# Patient Record
Sex: Female | Born: 2000 | Race: Black or African American | Hispanic: Yes | Marital: Single | Smoking: Never smoker
Health system: Southern US, Community
[De-identification: ages and names within clinical notes are randomized; demographics above are authoritative.]

## PROBLEM LIST (undated history)

## (undated) DIAGNOSIS — T1491XA Suicide attempt, initial encounter: Secondary | ICD-10-CM

## (undated) DIAGNOSIS — R519 Headache, unspecified: Secondary | ICD-10-CM

## (undated) DIAGNOSIS — R45851 Suicidal ideations: Secondary | ICD-10-CM

## (undated) DIAGNOSIS — F329 Major depressive disorder, single episode, unspecified: Secondary | ICD-10-CM

## (undated) DIAGNOSIS — G039 Meningitis, unspecified: Secondary | ICD-10-CM

## (undated) DIAGNOSIS — F32A Depression, unspecified: Secondary | ICD-10-CM

## (undated) DIAGNOSIS — Z62820 Parent-biological child conflict: Secondary | ICD-10-CM

## (undated) DIAGNOSIS — R51 Headache: Secondary | ICD-10-CM

## (undated) HISTORY — PX: WISDOM TOOTH EXTRACTION: SHX21

---
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
## 2021-03-07 ENCOUNTER — Other Ambulatory Visit: Payer: Self-pay

## 2021-03-07 ENCOUNTER — Emergency Department (HOSPITAL_BASED_OUTPATIENT_CLINIC_OR_DEPARTMENT_OTHER)
Admission: EM | Admit: 2021-03-07 | Discharge: 2021-03-07 | Disposition: A | Discharge status: Home / Self Care | Attending: Emergency Medicine | Admitting: Emergency Medicine

## 2021-03-07 ENCOUNTER — Encounter (HOSPITAL_BASED_OUTPATIENT_CLINIC_OR_DEPARTMENT_OTHER): Payer: Self-pay | Admitting: *Deleted

## 2021-03-07 DIAGNOSIS — W07XXXA Fall from chair, initial encounter: Secondary | ICD-10-CM | POA: Insufficient documentation

## 2021-03-07 DIAGNOSIS — Y9289 Other specified places as the place of occurrence of the external cause: Secondary | ICD-10-CM | POA: Diagnosis not present

## 2021-03-07 DIAGNOSIS — S7002XA Contusion of left hip, initial encounter: Secondary | ICD-10-CM

## 2021-03-07 DIAGNOSIS — Z3A20 20 weeks gestation of pregnancy: Secondary | ICD-10-CM | POA: Diagnosis not present

## 2021-03-07 DIAGNOSIS — O9A212 Injury, poisoning and certain other consequences of external causes complicating pregnancy, second trimester: Secondary | ICD-10-CM | POA: Insufficient documentation

## 2021-03-07 NOTE — ED Triage Notes (Addendum)
C/o fall from chair injuring left hip at work x 3 hrs ago , pt is preg, denies abd pain or vaginal bleeding

## 2021-03-07 NOTE — Discharge Instructions (Signed)
You may take Tylenol available over-the-counter according to label instructions as needed for pain. If you develop abdominal cramping, vaginal bleeding or change in babies activity please call your OB/GYN immediately for further evaluation and workup.

## 2021-03-07 NOTE — ED Provider Notes (Signed)
[facility] EMERGENCY DEPARTMENT Provider Note   CSN: [id number] Arrival date & time: 03/07/21  1429     History Chief Complaint  Patient presents with   Hip Pain    preg    Gloria Gray is a 20 y.o. female.  The history is provided by the patient.  Hip Pain Gloria Gray is a 20 y.o. female who presents to the Emergency Department complaining of fall, hip pain. She is a G1P0 at [redacted]w[redacted]d that presents the emergency department for evaluation of injuries after she fell out of a chair at work today. This happened around noon. She states that the chair was broken and she fell, landing on her left hip. She does have soreness to the area but is able to ambulate. She is approximately five months pregnant. No abdominal pain, vaginal bleeding or leakage of fluids.     Past Medical History:  Diagnosis Date   Attempted suicide (HCC)    Depression    HA (headache)    Meningitis    Parent-child relational problem 06/05/2015   Suicidal ideation    two times in past 5th and 6th grade    Patient Active Problem List   Diagnosis Date Noted   Parent-child relational problem 06/05/2015   Major depressive disorder, recurrent episode, severe (HCC) 06/04/2015   MDD (major depressive disorder), recurrent episode, severe (HCC) 06/04/2015   Tylenol overdose 06/02/2015   Acetaminophen overdose 06/02/2015    Past Surgical History:  Procedure Laterality Date   WISDOM TOOTH EXTRACTION       OB History     Gravida  1   Para      Term      Preterm      AB      Living         SAB      IAB      Ectopic      Multiple      Live Births              No family history on file.  Social History   Tobacco Use   Smoking status: Never   Smokeless tobacco: Never  Vaping Use   Vaping Use: Never used  Substance Use Topics   Alcohol use: Yes    Comment: occ   Drug use: No    Home Medications Prior to Admission medications   Medication Sig Start Date End  Date Taking? Authorizing Provider  benzonatate (TESSALON) 100 MG capsule Take 1 capsule (100 mg total) by mouth every 8 (eight) hours. 06/17/18   Fayrene Helper, PA-C  Doxylamine-Pyridoxine 10-10 MG TBEC Oral: Initial: Two tablets at bedtime on day 1 and 2; if symptoms persist, take 1 tablet in morning and 2 tablets at bedtime on day 3; if symptoms persist, may increase to 1 tablet in morning, 1 tablet mid-afternoon, and 2 tablets at bedtime on day 4 (maximum: doxylamine 40 mg/pyridoxine 40 mg (4 tablets) per day). 11/30/20   Pricilla Loveless, MD  escitalopram (LEXAPRO) 10 MG tablet Take 10 mg by mouth daily.    [provider]  guaiFENesin (ROBITUSSIN) 100 MG/5ML liquid Take 5-10 mLs (100-200 mg total) by mouth every 4 (four) hours as needed for congestion. 06/17/18   Fayrene Helper, PA-C  promethazine (PHENERGAN) 25 MG tablet Take 1 tablet (25 mg total) by mouth every 6 (six) hours as needed for nausea or vomiting. 11/30/20   Pricilla Loveless, MD    Allergies    Patient has no  known allergies.  Review of Systems   Review of Systems  All other systems reviewed and are negative.  Physical Exam Updated Vital Signs BP 113/66 (BP Location: Right Arm)   Pulse 98   Temp 98.1 F (36.7 C) (Oral)   Resp 16   Ht 5\' 1"  (1.549 m)   Wt 61.7 kg   LMP 10/14/2020   SpO2 100%   BMI 25.70 kg/m   Physical Exam Vitals and nursing note reviewed.  Constitutional:      Appearance: She is well-developed.  HENT:     Head: Normocephalic and atraumatic.  Cardiovascular:     Rate and Rhythm: Normal rate and regular rhythm.  Pulmonary:     Effort: Pulmonary effort is normal. No respiratory distress.  Abdominal:     Palpations: Abdomen is soft.     Tenderness: There is no abdominal tenderness. There is no guarding or rebound.     Comments: Gravid uterus  Musculoskeletal:     Comments: There is mild tenderness to palpation over the left SI joint and left lateral hip. There is range of motion intact to the  left hip, knee, ankle.  Skin:    General: Skin is warm and dry.  Neurological:     Mental Status: She is alert and oriented to person, place, and time.  Psychiatric:        Behavior: Behavior normal.    ED Results / Procedures / Treatments   Labs (all labs ordered are listed, but only abnormal results are displayed) Labs Reviewed - No data to display  EKG None  Radiology No results found.  Procedures Procedures   Medications Ordered in ED Medications - No data to display  ED Course  I have reviewed the triage vital signs and the nursing notes.  Pertinent labs & imaging results that were available during my care of the patient were reviewed by me and considered in my medical decision making (see chart for details).    MDM Rules/Calculators/A&P                         Patient is a G1 P0 at [redacted]w[redacted]d here for evaluation of hip pain after a fall from a chair to the ground. She does have mild soreness on examination with no evidence of acute fracture on examination. No direct abdominal trauma and no acute pregnancy complaints. Fetal Doppler with heart rate of 156. Discussed with patient home care for head contusion with Tylenol, rest. Discussed OB/GYN follow-up and return precautions   Final Clinical Impression(s) / ED Diagnoses Final diagnoses:  Contusion of left hip, initial encounter    Rx / DC Orders ED Discharge Orders     None        [redacted]w[redacted]d, MD 03/07/21 1558

## 2021-06-11 ENCOUNTER — Encounter (HOSPITAL_BASED_OUTPATIENT_CLINIC_OR_DEPARTMENT_OTHER): Payer: Self-pay | Admitting: Emergency Medicine

## 2021-06-11 ENCOUNTER — Emergency Department (HOSPITAL_BASED_OUTPATIENT_CLINIC_OR_DEPARTMENT_OTHER)

## 2021-06-11 ENCOUNTER — Emergency Department (HOSPITAL_BASED_OUTPATIENT_CLINIC_OR_DEPARTMENT_OTHER)
Admission: EM | Admit: 2021-06-11 | Discharge: 2021-06-11 | Disposition: A | Discharge status: Home / Self Care | Attending: Emergency Medicine | Admitting: Emergency Medicine

## 2021-06-11 ENCOUNTER — Other Ambulatory Visit: Payer: Self-pay

## 2021-06-11 DIAGNOSIS — O26893 Other specified pregnancy related conditions, third trimester: Secondary | ICD-10-CM | POA: Insufficient documentation

## 2021-06-11 DIAGNOSIS — R519 Headache, unspecified: Secondary | ICD-10-CM | POA: Diagnosis not present

## 2021-06-11 DIAGNOSIS — Z3A34 34 weeks gestation of pregnancy: Secondary | ICD-10-CM | POA: Insufficient documentation

## 2021-06-11 NOTE — ED Triage Notes (Signed)
Pt arrives pov reporting "feeling like something is drilling into my face on the left side this morning upon waking". Pt reports at this time only having pain around right eye. Pt endorses being [redacted] weeks pregnant, denies pregnancy issues. Pt denies vision issues. AOx4, ambulatory to triage. Reports going to [facility] and waiting for 2 hours after triage, so left.

## 2021-06-11 NOTE — ED Provider Notes (Signed)
[facility] EMERGENCY DEPARTMENT Provider Note   CSN: [id number] Arrival date & time: 06/11/21  1426     History Chief Complaint  Patient presents with   Eye Pain    Gloria Gray is a 20 y.o. female.  HPI     20 year old female with a history of major depressive disorder, prior suicide attempt, headaches, G1P0 at 34 weeks presents with concern for headache.  Reports headache began at 10 AM today.  Reports that it began as a stabbing near her the angle of her eye, she stood up to go look, and felt that spread to the frontal portion of her head and face.  Reports ports that it worsened over the course of 5 minutes, and then was severe.  It lasted about severity for about 3 to 4 hours, and then began to ease off and feel like her typical self diagnosed migraine headaches.  Denies nausea, vomiting, numbness, weakness, changes in vision, trouble walking or talking, facial droop.  Reports that she initially went to her OB who evaluated her and recommended that she go to the emergency department due to the severity and onset of her headache.  She went to [facility], but after waiting for 2 hours went to go see her primary care doctor, who again referred her to the emergency department.  Reports at this point she is feeling significantly improved and has dull continuing headache.  Reports she is here because she was referred by the physicians.  She works as a Lawyer at night.  Denies any other recent symptoms, no fevers, sore throat, congestion, trauma.  Had significant swelling over the weekend that improved now. BP have been ok. No visual changes. No bleeding, contractions, leakage of fluid. Feeling baby Leonia Corona moving.      Past Medical History:  Diagnosis Date   Attempted suicide (HCC)    Depression    HA (headache)    Meningitis    Parent-child relational problem 06/05/2015   Suicidal ideation    two times in past 5th and 6th grade    Patient Active Problem  List   Diagnosis Date Noted   Parent-child relational problem 06/05/2015   Major depressive disorder, recurrent episode, severe (HCC) 06/04/2015   MDD (major depressive disorder), recurrent episode, severe (HCC) 06/04/2015   Tylenol overdose 06/02/2015   Acetaminophen overdose 06/02/2015    Past Surgical History:  Procedure Laterality Date   WISDOM TOOTH EXTRACTION       OB History     Gravida  1   Para      Term      Preterm      AB      Living         SAB      IAB      Ectopic      Multiple      Live Births              History reviewed. No pertinent family history.  Social History   Tobacco Use   Smoking status: Never   Smokeless tobacco: Never  Vaping Use   Vaping Use: Never used  Substance Use Topics   Alcohol use: Yes    Comment: occ   Drug use: No    Home Medications Prior to Admission medications   Medication Sig Start Date End Date Taking? Authorizing Provider  benzonatate (TESSALON) 100 MG capsule Take 1 capsule (100 mg total) by mouth every 8 (eight) hours. 06/17/18  Fayrene Helper, PA-C  Doxylamine-Pyridoxine 10-10 MG TBEC Oral: Initial: Two tablets at bedtime on day 1 and 2; if symptoms persist, take 1 tablet in morning and 2 tablets at bedtime on day 3; if symptoms persist, may increase to 1 tablet in morning, 1 tablet mid-afternoon, and 2 tablets at bedtime on day 4 (maximum: doxylamine 40 mg/pyridoxine 40 mg (4 tablets) per day). 11/30/20   Pricilla Loveless, MD  escitalopram (LEXAPRO) 10 MG tablet Take 10 mg by mouth daily.    [provider]  guaiFENesin (ROBITUSSIN) 100 MG/5ML liquid Take 5-10 mLs (100-200 mg total) by mouth every 4 (four) hours as needed for congestion. 06/17/18   Fayrene Helper, PA-C  promethazine (PHENERGAN) 25 MG tablet Take 1 tablet (25 mg total) by mouth every 6 (six) hours as needed for nausea or vomiting. 11/30/20   Pricilla Loveless, MD    Allergies    Patient has no known allergies.  Review of Systems    Review of Systems  Constitutional:  Negative for fever.  HENT:  Negative for sore throat.   Eyes:  Negative for visual disturbance.  Respiratory:  Negative for cough and shortness of breath.   Cardiovascular:  Negative for chest pain.  Gastrointestinal:  Negative for abdominal pain, diarrhea, nausea and vomiting.  Genitourinary:  Negative for difficulty urinating.  Musculoskeletal:  Negative for back pain and neck pain.  Skin:  Negative for rash.  Neurological:  Positive for headaches. Negative for dizziness, syncope, facial asymmetry, weakness and numbness.   Physical Exam Updated Vital Signs BP 111/69 (BP Location: Right Arm)   Pulse 93   Temp 98.7 F (37.1 C) (Oral)   Resp 16   Ht 5\' 1"  (1.549 m)   Wt 74.8 kg   LMP 10/14/2020   SpO2 100%   BMI 31.18 kg/m   Physical Exam Vitals and nursing note reviewed.  Constitutional:      General: She is not in acute distress.    Appearance: She is well-developed. She is not diaphoretic.  HENT:     Head: Normocephalic and atraumatic.  Eyes:     General: No visual field deficit.    Conjunctiva/sclera: Conjunctivae normal.  Cardiovascular:     Rate and Rhythm: Normal rate and regular rhythm.     Heart sounds: Normal heart sounds. No murmur heard.   No friction rub. No gallop.  Pulmonary:     Effort: Pulmonary effort is normal. No respiratory distress.     Breath sounds: Normal breath sounds. No wheezing or rales.  Abdominal:     General: There is no distension.     Palpations: Abdomen is soft.     Tenderness: There is no abdominal tenderness. There is no guarding.  Musculoskeletal:        General: No tenderness.     Cervical back: Normal range of motion.  Skin:    General: Skin is warm and dry.     Findings: No erythema or rash.  Neurological:     Mental Status: She is alert and oriented to person, place, and time.     GCS: GCS eye subscore is 4. GCS verbal subscore is 5. GCS motor subscore is 6.     Cranial Nerves:  Cranial nerves are intact. No cranial nerve deficit, dysarthria or facial asymmetry.     Sensory: Sensation is intact. No sensory deficit.     Motor: Motor function is intact. No weakness.     Coordination: Coordination normal. Finger-Nose-Finger Test normal.  ED Results / Procedures / Treatments   Labs (all labs ordered are listed, but only abnormal results are displayed) Labs Reviewed - No data to display  EKG None  Radiology CT Head Wo Contrast  Result Date: 06/11/2021 CLINICAL DATA:  Headache EXAM: CT HEAD WITHOUT CONTRAST TECHNIQUE: Contiguous axial images were obtained from the base of the skull through the vertex without intravenous contrast. COMPARISON:  None. FINDINGS: Brain: There is no acute intracranial hemorrhage, extra-axial fluid collection, or acute infarct. Parenchymal volume is normal. The ventricles are normal in size. There is no mass lesion. There is no midline shift. Vascular: No hyperdense vessel or unexpected calcification. Skull: Normal. Negative for fracture or focal lesion. 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 The imaged globes and orbits are unremarkable. Other: None. IMPRESSION: No acute intracranial pathology. Electronically Signed   By: Lesia Hausen M.D.   On: 06/11/2021 15:34    Procedures Procedures   Medications Ordered in ED Medications - No data to display  ED Course  I have reviewed the triage vital signs and the nursing notes.  Pertinent labs & imaging results that were available during my care of the patient were reviewed by me and considered in my medical decision making (see chart for details).    MDM Rules/Calculators/A&P                            20 year old female with a history of major depressive disorder, prior suicide attempt, headaches, G1P0 at 34 weeks presents with concern for headache.  CT head completed within 6 hours of onset of  symptoms and shows no evidence of intracranial hemorrhage.  Given headache has improved, feels more like prior headaches she has had, has negative head CT less than 6 hours after onset of symptoms, do not feel that further evaluation of the lumbar puncture is indicated and have low suspicion for subarachnoid hemorrhage.  Have low suspicion for dural venous thrombosis with normal neurologic exam, headache similar to prior.  Her blood pressures are normal and have low suspicion for preeclampsia.  She had been cleared from the [facility] perspective prior to transfer to the emergency department, denies contractions, vaginal bleeding, leakage of fluid.  Recommend continued supportive care, return for new or worsening symptoms. Patient discharged in stable condition with understanding of reasons to return.     Final Clinical Impression(s) / ED Diagnoses Final diagnoses:  Acute nonintractable headache, unspecified headache type    Rx / DC Orders ED Discharge Orders     None        Alvira Monday, MD 06/12/21 [phone]

## 2022-03-19 IMAGING — CT CT HEAD W/O CM
3 series · 16 of 47 positions shown, 19 images · non-contrast
Comparison: None.

CLINICAL DATA: Headache

EXAM:
CT HEAD WITHOUT CONTRAST
TECHNIQUE: Contiguous axial images were obtained from the base of the skull
through the vertex without intravenous contrast.

[Series 2: head wo · axial · 0.41mm/px · z∈[-151,-26]mm · 10 of 31 slices shown, 13 images]
[im 3/31  brain]
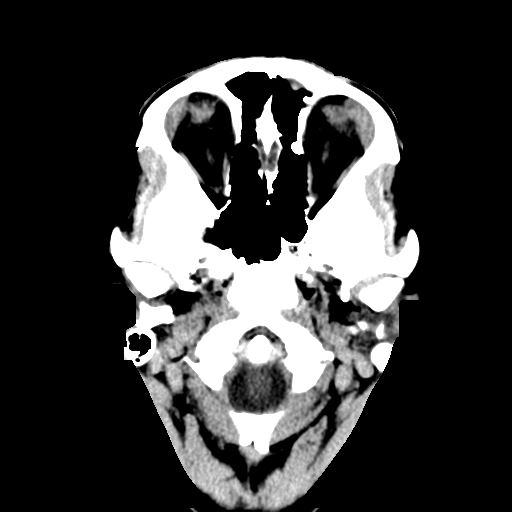
[im 3/31  bone]
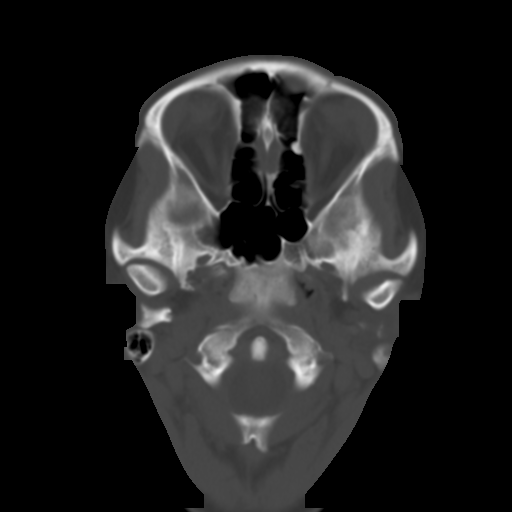
[im 6/31  brain]
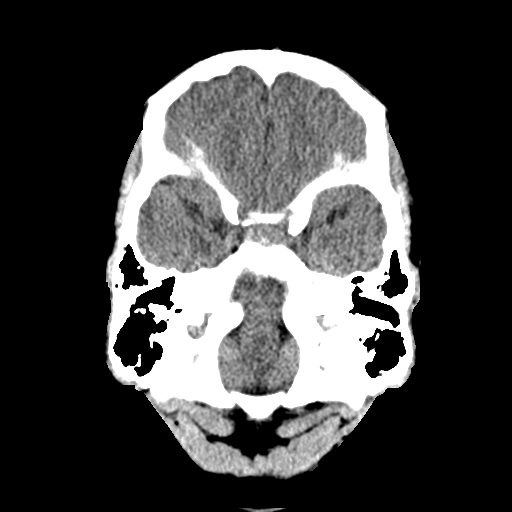
[im 9/31  brain]
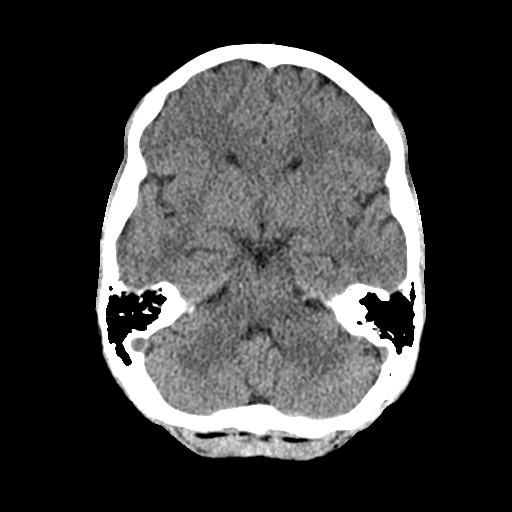
[im 11/31  brain]
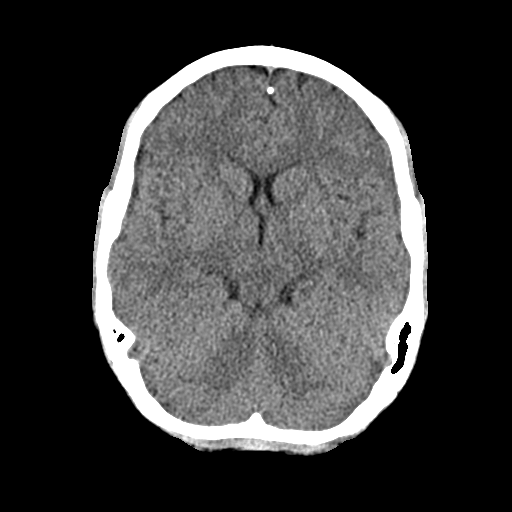
[im 14/31  brain]
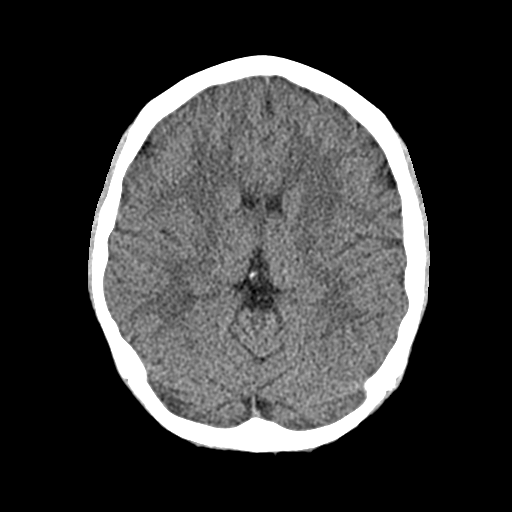
[im 14/31  bone]
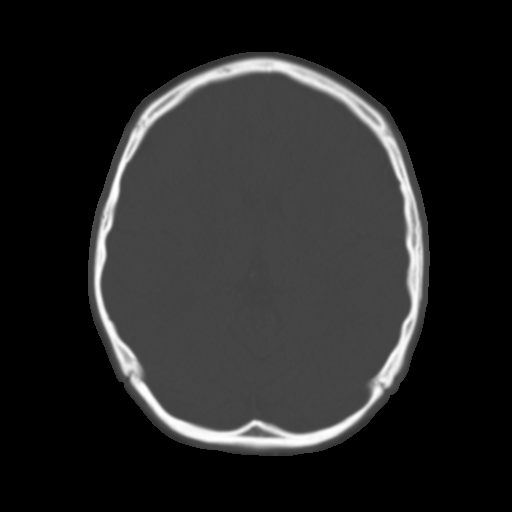
[im 17/31  brain]
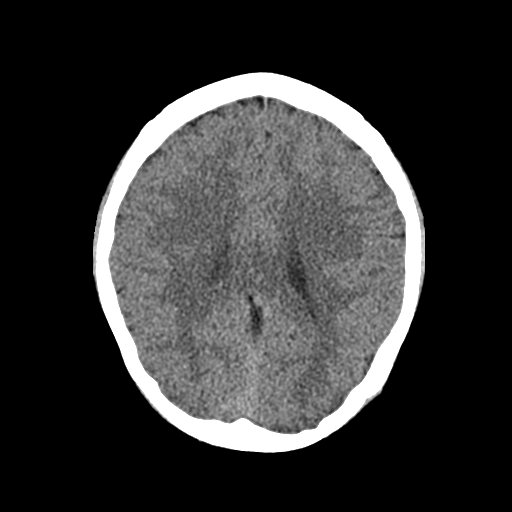
[im 20/31  brain]
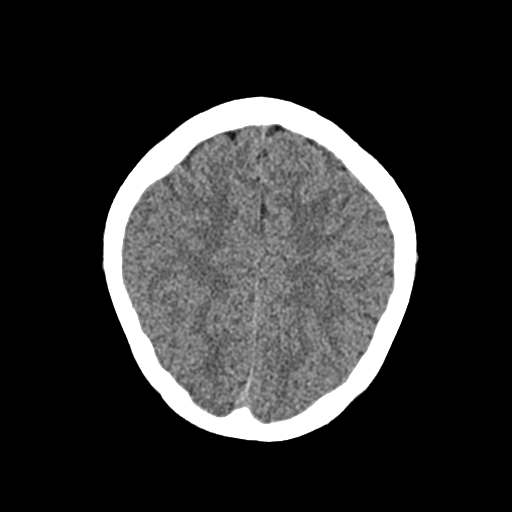
[im 23/31  brain]
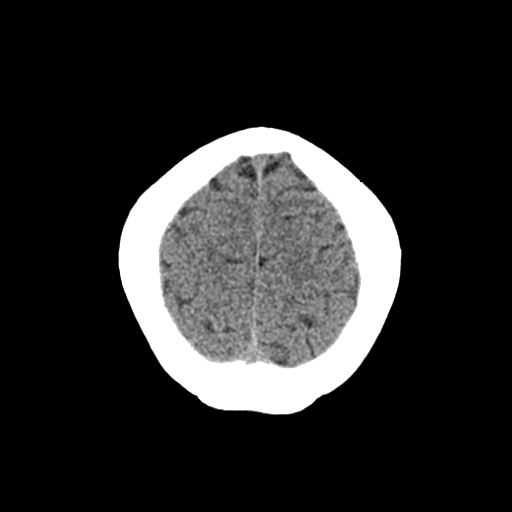
[im 25/31  brain]
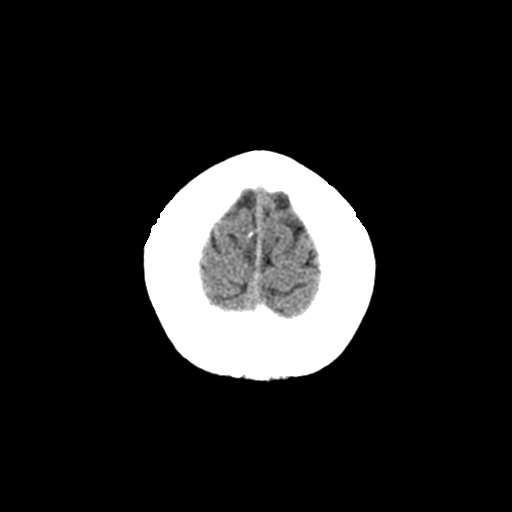
[im 25/31  bone]
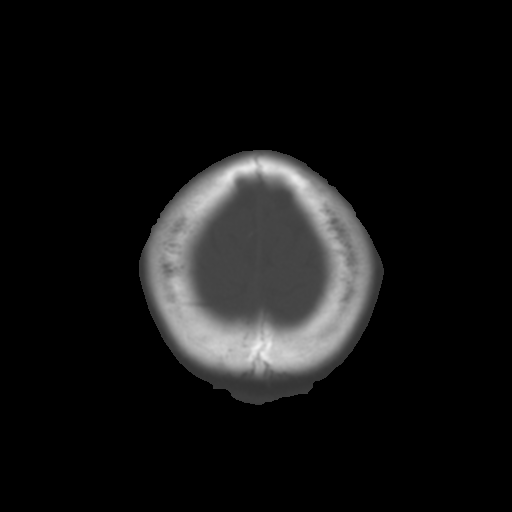
[im 28/31  brain]
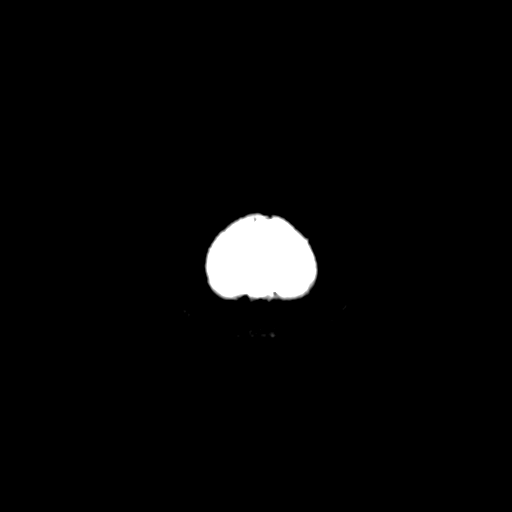

[Series 4: cor soft · coronal · 0.32mm/px · 3 of 60 slices shown]
[im 20/60  brain]
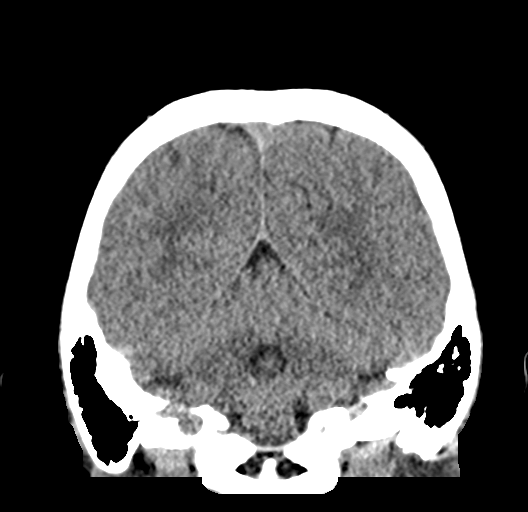
[im 27/60  brain]
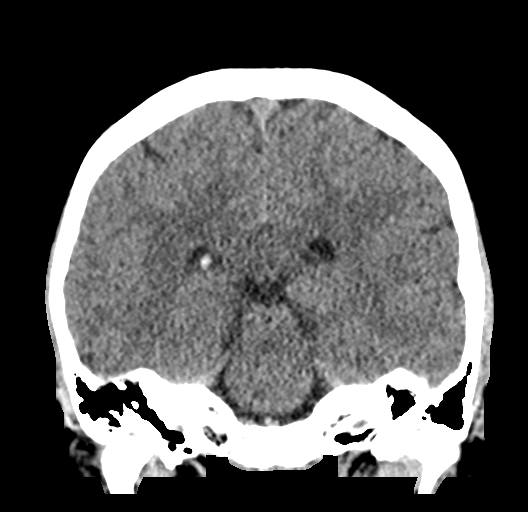
[im 33/60  brain]
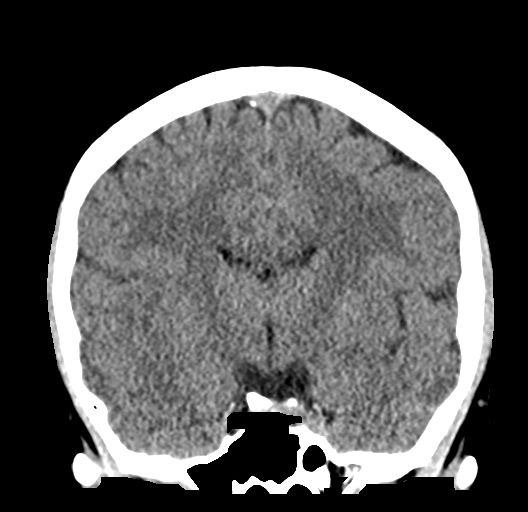

[Series 5: sag soft · sagittal · 0.31mm/px · 3 of 54 slices shown]
[im 18/54  brain]
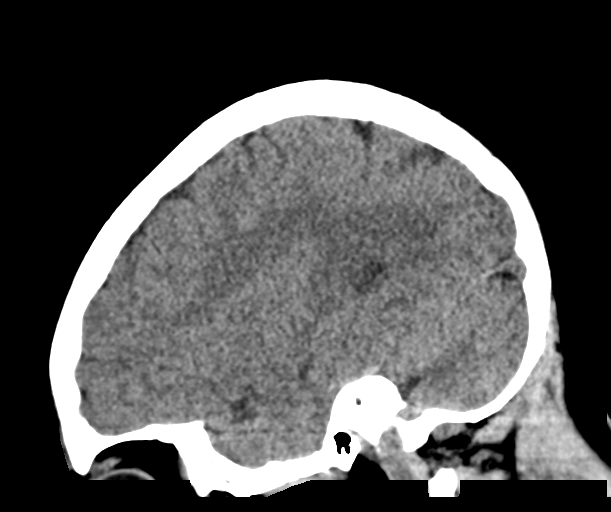
[im 27/54  brain]
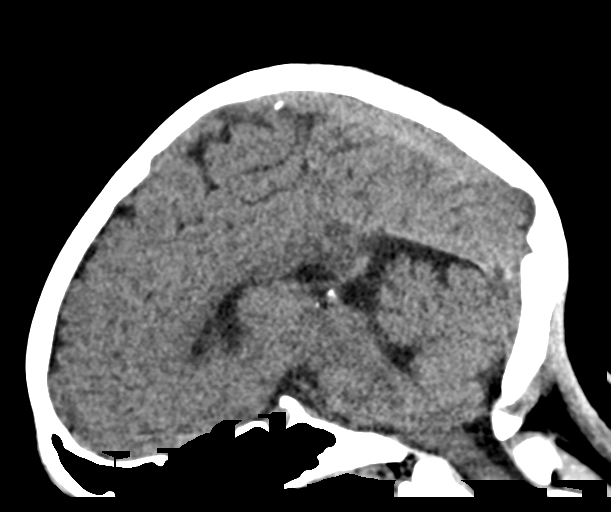
[im 36/54  brain]
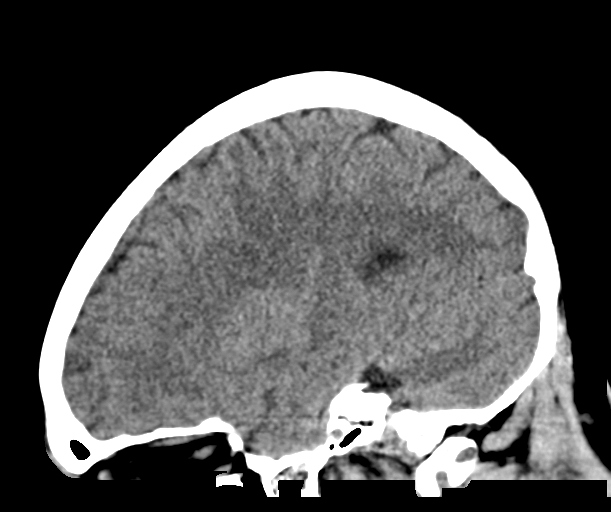

[16 of 47 positions shown; findings below may reference images not displayed]

FINDINGS: Brain:

There is no acute intracranial hemorrhage, extra-axial fluid
collection, or acute infarct.

Parenchymal volume is normal. The ventricles are normal in size.

There is no mass lesion. There is no midline shift.

Vascular: No hyperdense vessel or unexpected calcification.

Skull: Normal. Negative for fracture or focal lesion.

Sinuses/Orbits: There is a small mucous retention cyst in the left
frontal sinus and a small osteoma in a left ethmoid air cell. The
imaged globes and orbits are unremarkable.

Other: None.
IMPRESSION: No acute intracranial pathology.

## 2022-03-27 DIAGNOSIS — S76012A Strain of muscle, fascia and tendon of left hip, initial encounter: Secondary | ICD-10-CM | POA: Diagnosis not present

## 2022-03-27 DIAGNOSIS — S46002A Unspecified injury of muscle(s) and tendon(s) of the rotator cuff of left shoulder, initial encounter: Secondary | ICD-10-CM | POA: Diagnosis not present

## 2022-03-27 DIAGNOSIS — Y92410 Unspecified street and highway as the place of occurrence of the external cause: Secondary | ICD-10-CM | POA: Insufficient documentation

## 2022-03-27 DIAGNOSIS — S79912A Unspecified injury of left hip, initial encounter: Secondary | ICD-10-CM | POA: Diagnosis present

## 2022-03-28 ENCOUNTER — Emergency Department (HOSPITAL_COMMUNITY)
Admission: EM | Admit: 2022-03-28 | Discharge: 2022-03-28 | Disposition: A | Discharge status: Home / Self Care | Attending: Emergency Medicine | Admitting: Emergency Medicine

## 2022-03-28 ENCOUNTER — Encounter (HOSPITAL_COMMUNITY): Payer: Self-pay

## 2022-03-28 ENCOUNTER — Emergency Department (HOSPITAL_COMMUNITY)

## 2022-03-28 ENCOUNTER — Other Ambulatory Visit: Payer: Self-pay

## 2022-03-28 DIAGNOSIS — T148XXA Other injury of unspecified body region, initial encounter: Secondary | ICD-10-CM

## 2022-03-28 LAB — CBC WITH DIFFERENTIAL/PLATELET
Abs Immature Granulocytes: 0.02 10*3/uL (ref 0.00–0.07)
Basophils Absolute: 0 10*3/uL (ref 0.0–0.1)
Basophils Relative: 0 %
Eosinophils Absolute: 0.2 10*3/uL (ref 0.0–0.5)
Eosinophils Relative: 2 %
HCT: 38.5 % (ref 36.0–46.0)
Hemoglobin: 13.2 g/dL (ref 12.0–15.0)
Immature Granulocytes: 0 %
Lymphocytes Relative: 32 %
Lymphs Abs: 2.3 10*3/uL (ref 0.7–4.0)
MCH: 28.9 pg (ref 26.0–34.0)
MCHC: 34.3 g/dL (ref 30.0–36.0)
MCV: 84.2 fL (ref 80.0–100.0)
Monocytes Absolute: 0.4 10*3/uL (ref 0.1–1.0)
Monocytes Relative: 6 %
Neutro Abs: 4.2 10*3/uL (ref 1.7–7.7)
Neutrophils Relative %: 60 %
Platelets: 294 10*3/uL (ref 150–400)
RBC: 4.57 MIL/uL (ref 3.87–5.11)
RDW: 12.1 % (ref 11.5–15.5)
WBC: 7.2 10*3/uL (ref 4.0–10.5)
nRBC: 0 % (ref 0.0–0.2)

## 2022-03-28 LAB — I-STAT BETA HCG BLOOD, ED (MC, WL, AP ONLY): I-stat hCG, quantitative: <5 m[IU]/mL (ref <5)

## 2022-03-28 LAB — I-STAT CHEM 8, ED
BUN: 22 mg/dL — ABNORMAL HIGH (ref 6–20)
Calcium, Ion: 1.22 mmol/L (ref 1.15–1.40)
Chloride: 102 mmol/L (ref 98–111)
Creatinine, Ser: 0.7 mg/dL (ref 0.44–1.00)
Glucose, Bld: 87 mg/dL (ref 70–99)
HCT: 39 % (ref 36.0–46.0)
Hemoglobin: 13.3 g/dL (ref 12.0–15.0)
Potassium: 3.9 mmol/L (ref 3.5–5.1)
Sodium: 139 mmol/L (ref 135–145)
TCO2: 24 mmol/L (ref 22–32)

## 2022-03-28 MED ORDER — IBUPROFEN 600 MG PO TABS: 600.0000 mg | ORAL_TABLET | Freq: Four times a day (QID) | ORAL | 0 refills | Status: DC | PRN

## 2022-03-28 MED ORDER — IBUPROFEN 400 MG PO TABS
600.0000 mg | ORAL_TABLET | Freq: Once | ORAL | Status: AC
  Administered 2022-03-28: 600 mg via ORAL

## 2022-03-28 MED ORDER — CYCLOBENZAPRINE HCL 10 MG PO TABS: 10.0000 mg | ORAL_TABLET | Freq: Two times a day (BID) | ORAL | 0 refills | Status: DC | PRN

## 2022-03-28 MED ORDER — CYCLOBENZAPRINE HCL 10 MG PO TABS
5.0000 mg | ORAL_TABLET | Freq: Once | ORAL | Status: AC
Start: 2022-03-28 — End: 2022-03-28
  Administered 2022-03-28: 5 mg via ORAL
  Filled 2022-03-28: qty 1

## 2022-03-28 NOTE — ED Notes (Signed)
ED Provider at bedside. 
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
## 2023-01-26 ENCOUNTER — Inpatient Hospital Stay (HOSPITAL_COMMUNITY)
Admission: AD | Admit: 2023-01-26 | Discharge: 2023-01-26 | Disposition: A | Discharge status: Home / Self Care | Payer: Medicaid Other | Attending: Obstetrics & Gynecology | Admitting: Obstetrics & Gynecology

## 2023-01-26 ENCOUNTER — Encounter (HOSPITAL_COMMUNITY): Payer: Self-pay | Admitting: Obstetrics & Gynecology

## 2023-01-26 DIAGNOSIS — O26899 Other specified pregnancy related conditions, unspecified trimester: Secondary | ICD-10-CM

## 2023-01-26 DIAGNOSIS — O26892 Other specified pregnancy related conditions, second trimester: Secondary | ICD-10-CM | POA: Insufficient documentation

## 2023-01-26 DIAGNOSIS — Z3A16 16 weeks gestation of pregnancy: Secondary | ICD-10-CM | POA: Diagnosis not present

## 2023-01-26 DIAGNOSIS — R519 Headache, unspecified: Secondary | ICD-10-CM | POA: Insufficient documentation

## 2023-01-26 DIAGNOSIS — R109 Unspecified abdominal pain: Secondary | ICD-10-CM

## 2023-01-26 LAB — URINALYSIS, ROUTINE W REFLEX MICROSCOPIC
Bilirubin Urine: NEGATIVE
Glucose, UA: NEGATIVE mg/dL
Hgb urine dipstick: NEGATIVE
Ketones, ur: 20 mg/dL — AB
Leukocytes,Ua: NEGATIVE
Nitrite: NEGATIVE
Protein, ur: NEGATIVE mg/dL
Specific Gravity, Urine: 1.023 (ref 1.005–1.030)
pH: 6 (ref 5.0–8.0)

## 2023-01-26 LAB — WET PREP, GENITAL
Clue Cells Wet Prep HPF POC: NONE SEEN
Sperm: NONE SEEN
Trich, Wet Prep: NONE SEEN
WBC, Wet Prep HPF POC: >=10 — AB (ref <10)
Yeast Wet Prep HPF POC: NONE SEEN

## 2023-01-26 MED ORDER — ACETAMINOPHEN 500 MG PO TABS
1000.0000 mg | ORAL_TABLET | Freq: Once | ORAL | Status: AC
  Administered 2023-01-26: 1000 mg via ORAL
  Filled 2023-01-26: qty 2

## 2023-01-26 NOTE — MAU Note (Signed)
Gloria Gray is a 22 y.o. at Unknown here in MAU reporting: this morning when she got up , she was in excruciating pain, lower abd and hips. Had to get help from boyfriend.  Had to report for guard duty. Told them she wasn't feeling well.   This was her first day of work in 3 months.  Has a headache (asked for something for it in triage).   Has eaten. Denies GI or GU problems.  No bleeding or d/c.  Onset of complaint: woke up with pain this morning.  Pain score: 7/9 Vitals:   01/26/23 1205  BP: 107/64  Pulse: 98  Resp: 17  Temp: 98.6 F (37 C)  SpO2: 100%     FHT:156 Lab orders placed from triage:  urine   Care at [facility] in [place]

## 2023-01-26 NOTE — MAU Provider Note (Signed)
History     CSN: [id number]  Arrival date and time: 01/26/23 1152   None     Chief Complaint  Patient presents with   Abdominal Pain   Headache   HPI Gloria Gray is a 22 y.o. G2P1001 at [redacted]w[redacted]d who presents to MAU for lower abdominal cramping and hip pain. She reports pain started this morning while she was reporting for guard duty. Reports the pain was so bad her boyfriend had to help her because it was "hard to walk". Nothing made the pain better or worse. Did not take any medications. She reports since arrival, pain has improved. She also endorses a headache. She denies leaking fluid, vaginal bleeding, itching, odor, or urinary s/s. She reports normal BM's. She reports she did have intercourse last night.   Patient receives [facility] at [facility] in [place], next appointment is scheduled on Wednesday.   OB History     Gravida  2   Para  1   Term  1   Preterm      AB      Living  1      SAB      IAB      Ectopic      Multiple      Live Births  1           Past Medical History:  Diagnosis Date   Attempted suicide (HCC)    Depression    HA (headache)    Meningitis    Parent-child relational problem 06/05/2015   Suicidal ideation    two times in past 5th and 6th grade    Past Surgical History:  Procedure Laterality Date   WISDOM TOOTH EXTRACTION      History reviewed. No pertinent family history.  Social History   Tobacco Use   Smoking status: Never   Smokeless tobacco: Never  Vaping Use   Vaping Use: Never used  Substance Use Topics   Alcohol use: Yes    Comment: occ   Drug use: No    Allergies: No Known Allergies  No medications prior to admission.   Review of Systems  Gastrointestinal:  Positive for abdominal pain.  Neurological:  Positive for headaches.  All other systems reviewed and are negative.  Physical Exam   Blood pressure 92/60, pulse 96, temperature 98.6 F (37 C), temperature source Oral, resp.  rate 17, height 5\' 1"  (1.549 m), weight 61.7 kg, SpO2 100 %, unknown if currently breastfeeding.  Physical Exam Vitals and nursing note reviewed. Exam conducted with a chaperone present.  Cardiovascular:     Rate and Rhythm: Normal rate.  Pulmonary:     Effort: Pulmonary effort is normal. No respiratory distress.  Abdominal:     Palpations: Abdomen is soft.     Tenderness: There is no abdominal tenderness.  Genitourinary:    Comments: Blind swabs collected Skin:    General: Skin is warm and dry.  Neurological:     General: No focal deficit present.     Mental Status: She is alert and oriented to person, place, and time.  Psychiatric:        Mood and Affect: Mood normal.        Behavior: Behavior normal.   Dilation: Closed Effacement (%): Thick Exam by:: Camelia Eng, CNM  FHR: 156 bpm via doppler  Results for orders placed or performed during the hospital encounter of 01/26/23 (from the past 24 hour(s))  Urinalysis, Routine w reflex microscopic -Urine, Clean Catch  Status: Abnormal   Collection Time: 01/26/23 12:17 PM  Result Value Ref Range   Color, Urine YELLOW YELLOW   APPearance CLEAR CLEAR   Specific Gravity, Urine 1.023 1.005 - 1.030   pH 6.0 5.0 - 8.0   Glucose, UA NEGATIVE NEGATIVE mg/dL   Hgb urine dipstick NEGATIVE NEGATIVE   Bilirubin Urine NEGATIVE NEGATIVE   Ketones, ur 20 (A) NEGATIVE mg/dL   Protein, ur NEGATIVE NEGATIVE mg/dL   Nitrite NEGATIVE NEGATIVE   Leukocytes,Ua NEGATIVE NEGATIVE  Wet prep, genital     Status: Abnormal   Collection Time: 01/26/23  2:44 PM   Specimen: Vaginal  Result Value Ref Range   Yeast Wet Prep HPF POC NONE SEEN NONE SEEN   Trich, Wet Prep NONE SEEN NONE SEEN   Clue Cells Wet Prep HPF POC NONE SEEN NONE SEEN   WBC, Wet Prep HPF POC >=10 (A) <10   Sperm NONE SEEN     MAU Course  Procedures  MDM UA Wet prep, GC/CT Tylenol   UA and wet prep negative. GC/CT pending. Headache and abdominal pain improved after  Tylenol.   Assessment and Plan   1. [redacted] weeks gestation of pregnancy   2. Abdominal pain affecting pregnancy   3. Pregnancy headache in second trimester    - Discharge home in stable condition - Return precautions given. Return to MAU as needed - Keep OB appointment as scheduled on Wednesday  Brand Males, CNM 01/26/2023, 5:02 PM

## 2023-01-27 LAB — GC/CHLAMYDIA PROBE AMP (~~LOC~~) NOT AT ARMC
Chlamydia: NEGATIVE
Comment: NEGATIVE
Comment: NORMAL
Neisseria Gonorrhea: NEGATIVE

## 2024-01-27 ENCOUNTER — Encounter (HOSPITAL_COMMUNITY): Payer: Self-pay

## 2024-01-27 ENCOUNTER — Ambulatory Visit (HOSPITAL_COMMUNITY): Payer: Self-pay | Admitting: Nurse Practitioner

## 2024-01-27 ENCOUNTER — Ambulatory Visit (HOSPITAL_COMMUNITY)
Admission: EM | Admit: 2024-01-27 | Discharge: 2024-01-27 | Disposition: A | Discharge status: Home / Self Care | Attending: Nurse Practitioner | Admitting: Nurse Practitioner

## 2024-01-27 ENCOUNTER — Ambulatory Visit (INDEPENDENT_AMBULATORY_CARE_PROVIDER_SITE_OTHER)

## 2024-01-27 DIAGNOSIS — J069 Acute upper respiratory infection, unspecified: Secondary | ICD-10-CM | POA: Diagnosis not present

## 2024-01-27 MED ORDER — GUAIFENESIN ER 600 MG PO TB12
600.0000 mg | ORAL_TABLET | Freq: Two times a day (BID) | ORAL | 0 refills | Status: AC | PRN
Start: 2024-01-27 — End: ?

## 2024-01-27 MED ORDER — DEXTROMETHORPHAN HBR 15 MG/5ML PO SYRP: 10.0000 mL | ORAL_SOLUTION | Freq: Four times a day (QID) | ORAL | 0 refills | Status: AC | PRN

## 2024-01-27 NOTE — ED Triage Notes (Signed)
 Pt c/o cough since last Thursday. C/o center chest pain radiating to back when taking a deep breathe this am. Denies taken any meds. States she is currently breast feeding.

## 2024-01-27 NOTE — ED Provider Notes (Addendum)
 [facility]    CSN: [id number] Arrival date & time: 01/27/24  1123      History   Chief Complaint Chief Complaint  Patient presents with   Cough    HPI Gloria Gray is a 23 y.o. female.   Patient presents today with 1 week history of congested and dry cough, shortness of breath and chest pain that began this morning after coughing, stuffy nose, and fatigue.  She denies fever, body aches or chills, current chest pain or shortness of breath at rest, runny nose, postnasal drainage, sore throat, headache, ear pain, abdominal pain, nausea/vomiting, diarrhea, and significantly changed appetite.  Children have been sick with similar symptoms per her report.  Has not taken anything for symptoms so far because she does not know what she can take as she is currently breast-feeding.    Past Medical History:  Diagnosis Date   Attempted suicide (HCC)    Depression    HA (headache)    Meningitis    Parent-child relational problem 06/05/2015   Suicidal ideation    two times in past 5th and 6th grade    Patient Active Problem List   Diagnosis Date Noted   Parent-child relational problem 06/05/2015   Major depressive disorder, recurrent episode, severe (HCC) 06/04/2015   MDD (major depressive disorder), recurrent episode, severe (HCC) 06/04/2015   Tylenol  overdose 06/02/2015   Acetaminophen  overdose 06/02/2015    Past Surgical History:  Procedure Laterality Date   WISDOM TOOTH EXTRACTION      OB History     Gravida  2   Para  1   Term  1   Preterm      AB      Living  1      SAB      IAB      Ectopic      Multiple      Live Births  1            Home Medications    Prior to Admission medications   Medication Sig Start Date End Date Taking? Authorizing Provider  dextromethorphan 15 MG/5ML syrup Take 10 mLs (30 mg total) by mouth 4 (four) times daily as needed for cough. 01/27/24  Yes Wilhemena Harbour, NP  guaiFENesin  (MUCINEX ) 600 MG  12 hr tablet Take 1 tablet (600 mg total) by mouth 2 (two) times daily as needed for cough or to loosen phlegm. 01/27/24  Yes Wilhemena Harbour, NP    Family History History reviewed. No pertinent family history.  Social History Social History   Tobacco Use   Smoking status: Never   Smokeless tobacco: Never  Vaping Use   Vaping status: Never Used  Substance Use Topics   Alcohol use: Yes    Comment: occ   Drug use: No     Allergies   Patient has no known allergies.   Review of Systems Review of Systems Per HPI  Physical Exam Triage Vital Signs ED Triage Vitals [01/27/24 1254]  Encounter Vitals Group     BP 113/81     Systolic BP Percentile      Diastolic BP Percentile      Pulse Rate 96     Resp 18     Temp 98.3 F (36.8 C)     Temp Source Oral     SpO2 99 %     Weight      Height      Head Circumference  Peak Flow      Pain Score 5     Pain Loc      Pain Education      Exclude from Growth Chart    No data found.  Updated Vital Signs BP 113/81 (BP Location: Right Arm)   Pulse 96   Temp 98.3 F (36.8 C) (Oral)   Resp 18   LMP 01/16/2024 (Exact Date)   SpO2 99%   Breastfeeding Yes   Visual Acuity Right Eye Distance:   Left Eye Distance:   Bilateral Distance:    Right Eye Near:   Left Eye Near:    Bilateral Near:     Physical Exam Vitals and nursing note reviewed.  Constitutional:      General: She is not in acute distress.    Appearance: Normal appearance. She is not ill-appearing or toxic-appearing.  HENT:     Head: Normocephalic and atraumatic.     Right Ear: Tympanic membrane, ear canal and external ear normal.     Left Ear: Tympanic membrane, ear canal and external ear normal.     Nose: No congestion or rhinorrhea.     Mouth/Throat:     Mouth: Mucous membranes are moist.     Pharynx: Oropharynx is clear. Posterior oropharyngeal erythema present. No oropharyngeal exudate.  Eyes:     General: No scleral icterus.    Extraocular  Movements: Extraocular movements intact.  Cardiovascular:     Rate and Rhythm: Normal rate and regular rhythm.  Pulmonary:     Effort: Pulmonary effort is normal. No respiratory distress.     Breath sounds: Normal breath sounds. No wheezing, rhonchi or rales.  Musculoskeletal:     Cervical back: Normal range of motion and neck supple.  Lymphadenopathy:     Cervical: No cervical adenopathy.  Skin:    General: Skin is warm and dry.     Coloration: Skin is not jaundiced or pale.     Findings: No erythema or rash.  Neurological:     Mental Status: She is alert and oriented to person, place, and time.  Psychiatric:        Behavior: Behavior is cooperative.      UC Treatments / Results  Labs (all labs ordered are listed, but only abnormal results are displayed) Labs Reviewed - No data to display  EKG   Radiology No results found.  Procedures Procedures (including critical care time)  Medications Ordered in UC Medications - No data to display  Initial Impression / Assessment and Plan / UC Course  I have reviewed the triage vital signs and the nursing notes.  Pertinent labs & imaging results that were available during my care of the patient were reviewed by me and considered in my medical decision making (see chart for details).   Patient is well-appearing, normotensive, afebrile, not tachycardic, not tachypneic, oxygenating well on room air.   1. Viral URI with cough Overall, vitals and exam are reassuring Viral testing deferred given length of symptoms Chest x-ray pending at time of discharge; per my independent review, I do not appreciate any cardiopulmonary abnormality and lungs are clear to auscultation-reassurance provided to the patient Start dextromethorphan and guaifenesin  for cough, other supportive care discussed as well ER and return precautions discussed with patient Form filled out for patient's work Therapist, nutritional and copy placed for scanning into  chart  Update: Chest xray is negative.  No change to treatment plan.  The patient was given the opportunity to ask questions.  All questions answered to their satisfaction.  The patient is in agreement to this plan.   Final Clinical Impressions(s) / UC Diagnoses   Final diagnoses:  Viral URI with cough     Discharge Instructions      You have a viral upper respiratory infection.  Symptoms should improve over the next week to 10 days.  If you develop chest pain or shortness of breath, go to the emergency room.  I will contact you later today if the results of the chest xray show pneumonia or that we need to change treatment in any way.   Some things that can make you feel better are: - Increased rest - Increasing fluid with water/sugar free electrolytes - Acetaminophen  and ibuprofen  as needed for fever/pain - Salt water gargling, chloraseptic spray and throat lozenges - OTC guaifenesin  (Mucinex ) 600 mg twice daily - Saline sinus flushes or a neti pot - Humidifying the air - Dextromethorphan for cough   ED Prescriptions     Medication Sig Dispense Auth. Provider   guaiFENesin  (MUCINEX ) 600 MG 12 hr tablet Take 1 tablet (600 mg total) by mouth 2 (two) times daily as needed for cough or to loosen phlegm. 30 tablet Thena Fireman A, NP   dextromethorphan 15 MG/5ML syrup Take 10 mLs (30 mg total) by mouth 4 (four) times daily as needed for cough. 120 mL Wilhemena Harbour, NP      PDMP not reviewed this encounter.   Wilhemena Harbour, NP 01/27/24 1526    Wilhemena Harbour, NP 01/27/24 [phone]

## 2024-01-27 NOTE — Discharge Instructions (Signed)
 You have a viral upper respiratory infection.  Symptoms should improve over the next week to 10 days.  If you develop chest pain or shortness of breath, go to the emergency room.  I will contact you later today if the results of the chest xray show pneumonia or that we need to change treatment in any way.   Some things that can make you feel better are: - Increased rest - Increasing fluid with water/sugar free electrolytes - Acetaminophen  and ibuprofen  as needed for fever/pain - Salt water gargling, chloraseptic spray and throat lozenges - OTC guaifenesin  (Mucinex ) 600 mg twice daily - Saline sinus flushes or a neti pot - Humidifying the air - Dextromethorphan for cough
